# Patient Record
Sex: Female | Born: 1945 | Race: White | Hispanic: No | Smoking: Never smoker
Health system: Southern US, Community
[De-identification: ages and names within clinical notes are randomized; demographics above are authoritative.]

## PROBLEM LIST (undated history)

## (undated) DIAGNOSIS — I10 Essential (primary) hypertension: Secondary | ICD-10-CM

## (undated) DIAGNOSIS — I4891 Unspecified atrial fibrillation: Secondary | ICD-10-CM

## (undated) HISTORY — PX: CHOLECYSTECTOMY: SHX55

## (undated) HISTORY — PX: CATARACT EXTRACTION: SUR2

## (undated) HISTORY — DX: Unspecified atrial fibrillation: I48.91

## (undated) HISTORY — PX: BACK SURGERY: SHX140

## (undated) HISTORY — PX: FOOT SURGERY: SHX648

---

## 2010-12-21 ENCOUNTER — Emergency Department (HOSPITAL_BASED_OUTPATIENT_CLINIC_OR_DEPARTMENT_OTHER)
Admission: EM | Admit: 2010-12-21 | Discharge: 2010-12-22 | Disposition: A | Discharge status: Home / Self Care | Payer: Medicare Other | Attending: Emergency Medicine | Admitting: Emergency Medicine

## 2010-12-21 DIAGNOSIS — N390 Urinary tract infection, site not specified: Secondary | ICD-10-CM | POA: Insufficient documentation

## 2010-12-21 DIAGNOSIS — R109 Unspecified abdominal pain: Secondary | ICD-10-CM | POA: Insufficient documentation

## 2010-12-21 DIAGNOSIS — I10 Essential (primary) hypertension: Secondary | ICD-10-CM | POA: Insufficient documentation

## 2010-12-21 LAB — URINALYSIS, ROUTINE W REFLEX MICROSCOPIC
Bilirubin Urine: NEGATIVE
Ketones, ur: NEGATIVE mg/dL
Nitrite: NEGATIVE
Protein, ur: NEGATIVE mg/dL
Urobilinogen, UA: 1 mg/dL (ref 0.0–1.0)

## 2014-01-18 ENCOUNTER — Emergency Department (HOSPITAL_BASED_OUTPATIENT_CLINIC_OR_DEPARTMENT_OTHER): Payer: Medicare Other

## 2014-01-18 ENCOUNTER — Encounter (HOSPITAL_BASED_OUTPATIENT_CLINIC_OR_DEPARTMENT_OTHER): Payer: Self-pay | Admitting: Emergency Medicine

## 2014-01-18 ENCOUNTER — Emergency Department (HOSPITAL_BASED_OUTPATIENT_CLINIC_OR_DEPARTMENT_OTHER)
Admission: EM | Admit: 2014-01-18 | Discharge: 2014-01-18 | Disposition: A | Discharge status: Home / Self Care | Payer: Medicare Other | Attending: Emergency Medicine | Admitting: Emergency Medicine

## 2014-01-18 DIAGNOSIS — Y9289 Other specified places as the place of occurrence of the external cause: Secondary | ICD-10-CM | POA: Insufficient documentation

## 2014-01-18 DIAGNOSIS — S20219A Contusion of unspecified front wall of thorax, initial encounter: Secondary | ICD-10-CM | POA: Insufficient documentation

## 2014-01-18 DIAGNOSIS — Z880 Allergy status to penicillin: Secondary | ICD-10-CM | POA: Insufficient documentation

## 2014-01-18 DIAGNOSIS — Y939 Activity, unspecified: Secondary | ICD-10-CM | POA: Insufficient documentation

## 2014-01-18 DIAGNOSIS — W1809XA Striking against other object with subsequent fall, initial encounter: Secondary | ICD-10-CM | POA: Insufficient documentation

## 2014-01-18 DIAGNOSIS — S20211A Contusion of right front wall of thorax, initial encounter: Secondary | ICD-10-CM

## 2014-01-18 MED ORDER — OXYCODONE-ACETAMINOPHEN 5-325 MG PO TABS: 1.0000 | ORAL_TABLET | Freq: Four times a day (QID) | ORAL | Status: DC | PRN

## 2014-01-18 MED ORDER — OXYCODONE-ACETAMINOPHEN 5-325 MG PO TABS
2.0000 | ORAL_TABLET | Freq: Once | ORAL | Status: AC
  Administered 2014-01-18: 2 via ORAL
  Filled 2014-01-18: qty 2

## 2014-01-18 NOTE — ED Notes (Signed)
Tripped/fell aprox 1 hour PTA-pain to right rib from hitting on foot board knob

## 2014-01-18 NOTE — ED Provider Notes (Signed)
CSN: [id number]     Arrival date & time 01/18/14  1512 History   First MD Initiated Contact with Patient 01/18/14 1521     Chief Complaint  Patient presents with  . Fall     (Consider location/radiation/quality/duration/timing/severity/associated sxs/prior Treatment) The history is provided by the patient.  Melissa Bowen is a 68 y.o. female hx of previous back surgery here with fall. 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 The foot got caught and she tripped and fell and hit the right side of her ribs on a knob on the foot board. She had right-sided rib pain afterwards. Denies loss of consciousness or head injury. Denies abdominal pain.    History reviewed. No pertinent past medical history. Past Surgical History  Procedure Laterality Date  . Back surgery    . Cataract extraction    . Cholecystectomy    . Foot surgery     No family history on file. History  Substance Use Topics  . Smoking status: Never Smoker   . Smokeless tobacco: Not on file  . Alcohol Use: No   OB History   Grav Para Term Preterm Abortions TAB SAB Ect Mult Living                 Review of Systems  Musculoskeletal:       R sided rib pain   All other systems reviewed and are negative.     Allergies  Doxycycline and Penicillins  Home Medications   Prior to Admission medications   Medication Sig Start Date End Date Taking? Authorizing Provider  Fish Oil OIL by Does not apply route.   Yes Historical Provider, MD  HYDRALAZINE-HCTZ PO Take by mouth.   Yes Historical Provider, MD  Loratadine (CLARITIN PO) Take by mouth.   Yes Historical Provider, MD  Red Yeast Rice Extract (RED YEAST RICE PO) Take by mouth.   Yes Historical Provider, MD   BP 128/82  Pulse 92  Temp(Src) 98.3 F (36.8 C) (Oral)  Resp 16  Ht 5\' 8"  (1.727 m)  Wt 210 lb (95.255 kg)  BMI 31.94 kg/m2  SpO2 97% Physical Exam  Nursing note and vitals reviewed. Constitutional: She is oriented to person, place, and time. She  appears well-nourished.  Uncomfortable   HENT:  Head: Normocephalic and atraumatic.  Mouth/Throat: Oropharynx is clear and moist.  Eyes: Conjunctivae and EOM are normal. Pupils are equal, round, and reactive to light.  Neck: Normal range of motion. Neck supple.  Cardiovascular: Normal rate, regular rhythm and normal heart sounds.   Pulmonary/Chest: Effort normal and breath sounds normal. No respiratory distress. She has no wheezes. She has no rales.  + tenderness R anterior 10th and 11th ribs   Abdominal: Soft. Bowel sounds are normal. She exhibits no distension. There is no tenderness. There is no rebound and no guarding.  No RUQ tenderness no CVAT   Musculoskeletal: Normal range of motion. She exhibits no edema and no tenderness.  Neurological: She is alert and oriented to person, place, and time. No cranial nerve deficit. Coordination normal.  Skin: Skin is warm and dry.  Psychiatric: She has a normal mood and affect. Her behavior is normal. Judgment and thought content normal.    ED Course  Procedures (including critical care time) Labs Review Labs Reviewed - No data to display  Imaging Review Dg Ribs Unilateral W/chest Right  01/18/2014   CLINICAL DATA:  Fall, injury, RIGHT anterior rib pain  EXAM: RIGHT RIBS AND  CHEST - 3+ VIEW  COMPARISON:  None  FINDINGS: BB placed at site of symptoms lower RIGHT chest.  Normal heart size, mediastinal contours, and pulmonary vascularity.  Lungs clear.  No pleural effusion or pneumothorax.  No acute osseous findings.  Bones slightly demineralized.  No rib fracture or bone destruction.  IMPRESSION: No acute abnormalities.   Electronically Signed   By: Ulyses SouthwardMark  Boles M.D.   On: 01/18/2014 15:42     EKG Interpretation None      MDM   Final diagnoses:  None    Melissa Bowen is a 68 y.o. female here with R rib pain s/p injury. Will get xray to r/o rib fracture. Will give pain meds.   4:28 PM Xray showed no fracture. Felt better with percocet.  Will d/c home with same.    Richardean Canalavid H Yao, MD 01/18/14 [phone]

## 2014-01-18 NOTE — Discharge Instructions (Signed)
Take tylenol or motrin for pain.   For severe pain, take percocet as prescribed. Do NOT drive with it.   Follow up with your doctor,   Return to ER if you have trouble breathing, severe pain, fever.

## 2014-10-15 ENCOUNTER — Encounter (HOSPITAL_BASED_OUTPATIENT_CLINIC_OR_DEPARTMENT_OTHER): Payer: Self-pay | Admitting: *Deleted

## 2014-10-15 ENCOUNTER — Emergency Department (HOSPITAL_BASED_OUTPATIENT_CLINIC_OR_DEPARTMENT_OTHER): Payer: Medicare Other

## 2014-10-15 ENCOUNTER — Emergency Department (HOSPITAL_BASED_OUTPATIENT_CLINIC_OR_DEPARTMENT_OTHER)
Admission: EM | Admit: 2014-10-15 | Discharge: 2014-10-15 | Disposition: A | Discharge status: Home / Self Care | Payer: Medicare Other | Attending: Emergency Medicine | Admitting: Emergency Medicine

## 2014-10-15 DIAGNOSIS — Z79899 Other long term (current) drug therapy: Secondary | ICD-10-CM | POA: Insufficient documentation

## 2014-10-15 DIAGNOSIS — R42 Dizziness and giddiness: Secondary | ICD-10-CM | POA: Diagnosis not present

## 2014-10-15 DIAGNOSIS — Z880 Allergy status to penicillin: Secondary | ICD-10-CM | POA: Diagnosis not present

## 2014-10-15 LAB — COMPREHENSIVE METABOLIC PANEL
ALBUMIN: 4.1 g/dL (ref 3.5–5.2)
ALK PHOS: 97 U/L (ref 39–117)
ALT: 22 U/L (ref 0–35)
AST: 23 U/L (ref 0–37)
Anion gap: 6 (ref 5–15)
BILIRUBIN TOTAL: 1.4 mg/dL — AB (ref 0.3–1.2)
BUN: 19 mg/dL (ref 6–23)
CHLORIDE: 105 mmol/L (ref 96–112)
CO2: 24 mmol/L (ref 19–32)
CREATININE: 0.65 mg/dL (ref 0.50–1.10)
Calcium: 9 mg/dL (ref 8.4–10.5)
GFR calc Af Amer: >90 mL/min (ref >90)
GFR, EST NON AFRICAN AMERICAN: 89 mL/min — AB (ref >90)
Glucose, Bld: 125 mg/dL — ABNORMAL HIGH (ref 70–99)
Potassium: 3.3 mmol/L — ABNORMAL LOW (ref 3.5–5.1)
SODIUM: 135 mmol/L (ref 135–145)
TOTAL PROTEIN: 7.2 g/dL (ref 6.0–8.3)

## 2014-10-15 LAB — CBC WITH DIFFERENTIAL/PLATELET
BASOS PCT: 0 % (ref 0–1)
Basophils Absolute: 0 10*3/uL (ref 0.0–0.1)
EOS PCT: 0 % (ref 0–5)
Eosinophils Absolute: 0 10*3/uL (ref 0.0–0.7)
HEMATOCRIT: 40.5 % (ref 36.0–46.0)
Hemoglobin: 13.9 g/dL (ref 12.0–15.0)
LYMPHS ABS: 0.8 10*3/uL (ref 0.7–4.0)
Lymphocytes Relative: 7 % — ABNORMAL LOW (ref 12–46)
MCH: 29.3 pg (ref 26.0–34.0)
MCHC: 34.3 g/dL (ref 30.0–36.0)
MCV: 85.3 fL (ref 78.0–100.0)
MONO ABS: 0.5 10*3/uL (ref 0.1–1.0)
Monocytes Relative: 5 % (ref 3–12)
NEUTROS ABS: 10 10*3/uL — AB (ref 1.7–7.7)
Neutrophils Relative %: 88 % — ABNORMAL HIGH (ref 43–77)
Platelets: 229 10*3/uL (ref 150–400)
RBC: 4.75 MIL/uL (ref 3.87–5.11)
RDW: 13 % (ref 11.5–15.5)
WBC: 11.4 10*3/uL — AB (ref 4.0–10.5)

## 2014-10-15 LAB — CBG MONITORING, ED: Glucose-Capillary: 124 mg/dL — ABNORMAL HIGH (ref 70–99)

## 2014-10-15 MED ORDER — ONDANSETRON 4 MG PO TBDP: 4.0000 mg | ORAL_TABLET | Freq: Three times a day (TID) | ORAL | Status: DC | PRN

## 2014-10-15 MED ORDER — ONDANSETRON HCL 4 MG/2ML IJ SOLN
INTRAMUSCULAR | Status: AC
  Administered 2014-10-15: 4 mg
  Filled 2014-10-15: qty 2

## 2014-10-15 MED ORDER — ONDANSETRON 4 MG PO TBDP
4.0000 mg | ORAL_TABLET | Freq: Once | ORAL | Status: AC
  Administered 2014-10-15: 4 mg via ORAL

## 2014-10-15 MED ORDER — LORAZEPAM 2 MG/ML IJ SOLN
1.0000 mg | Freq: Once | INTRAMUSCULAR | Status: AC
  Administered 2014-10-15: 1 mg via INTRAVENOUS
  Filled 2014-10-15: qty 1

## 2014-10-15 MED ORDER — MECLIZINE HCL 25 MG PO TABS
25.0000 mg | ORAL_TABLET | Freq: Once | ORAL | Status: AC
  Administered 2014-10-15: 25 mg via ORAL
  Filled 2014-10-15: qty 1

## 2014-10-15 MED ORDER — ONDANSETRON HCL 4 MG/2ML IJ SOLN: 4.0000 mg | Freq: Once | INTRAMUSCULAR | Status: DC

## 2014-10-15 MED ORDER — MECLIZINE HCL 25 MG PO TABS: 25.0000 mg | ORAL_TABLET | Freq: Three times a day (TID) | ORAL | Status: DC | PRN

## 2014-10-15 MED ORDER — ONDANSETRON 4 MG PO TBDP
ORAL_TABLET | ORAL | Status: AC
  Administered 2014-10-15: 4 mg via ORAL
  Filled 2014-10-15: qty 1

## 2014-10-15 NOTE — ED Provider Notes (Signed)
CSN: [id number]     Arrival date & time 10/15/14  1850 History   First MD Initiated Contact with Patient 10/15/14 1904     Chief Complaint  Patient presents with  . Dizziness     (Consider location/radiation/quality/duration/timing/severity/associated sxs/prior Treatment) HPI Comments: Denies any previous history. Acute onset in nature. Worse with change of position. Has had vomiting  Patient is a 69 y.o. female presenting with dizziness. The history is provided by the patient. No language interpreter was used.  Dizziness Quality:  Room spinning Severity:  Severe Onset quality:  Sudden Timing:  Intermittent Progression:  Waxing and waning Chronicity:  New Context: bending over, head movement and standing up   Relieved by:  Nothing Worsened by:  Being still Ineffective treatments:  Being still and closing eyes   History reviewed. No pertinent past medical history. Past Surgical History  Procedure Laterality Date  . Back surgery    . Cataract extraction    . Cholecystectomy    . Foot surgery     History reviewed. No pertinent family history. History  Substance Use Topics  . Smoking status: Never Smoker   . Smokeless tobacco: Not on file  . Alcohol Use: No   OB History    No data available     Review of Systems  Neurological: Positive for dizziness.  All other systems reviewed and are negative.     Allergies  Doxycycline and Penicillins  Home Medications   Prior to Admission medications   Medication Sig Start Date End Date Taking? Authorizing Provider  Fish Oil OIL by Does not apply route.    Historical Provider, MD  HYDRALAZINE-HCTZ PO Take by mouth.    Historical Provider, MD  Loratadine (CLARITIN PO) Take by mouth.    Historical Provider, MD  oxyCODONE-acetaminophen (PERCOCET) 5-325 MG per tablet Take 1-2 tablets by mouth every 6 (six) hours as needed. 01/18/14   Richardean Canalavid H Yao, MD  Red Yeast Rice Extract (RED YEAST RICE PO) Take by mouth.    Historical  Provider, MD   BP 131/73 mmHg  Pulse 80  Temp(Src) 97.7 F (36.5 C) (Oral)  Resp 18  Ht 5\' 8"  (1.727 m)  Wt 215 lb (97.523 kg)  BMI 32.70 kg/m2  SpO2 98% Physical Exam  Constitutional: She is oriented to person, place, and time. She appears well-developed and well-nourished.  HENT:  Right Ear: External ear normal.  Left Ear: External ear normal.  Eyes: Conjunctivae are normal. Pupils are equal, round, and reactive to light.  Neck: Normal range of motion. Neck supple.  Cardiovascular: Normal rate and regular rhythm.   Pulmonary/Chest: Effort normal and breath sounds normal.  Abdominal: Soft. Bowel sounds are normal.  Musculoskeletal: Normal range of motion.  Neurological: She is alert and oriented to person, place, and time. She exhibits normal muscle tone. Coordination normal.  Negative finger to nose or romberg  Skin: Skin is warm and dry.  Nursing note and vitals reviewed.   ED Course  Procedures (including critical care time) Labs Review Labs Reviewed  COMPREHENSIVE METABOLIC PANEL - Abnormal; Notable for the following:    Potassium 3.3 (*)    Glucose, Bld 125 (*)    Total Bilirubin 1.4 (*)    GFR calc non Af Amer 89 (*)    All other components within normal limits  CBC WITH DIFFERENTIAL/PLATELET - Abnormal; Notable for the following:    WBC 11.4 (*)    Neutrophils Relative % 88 (*)    Neutro Abs 10.0 (*)  Lymphocytes Relative 7 (*)    All other components within normal limits  CBG MONITORING, ED - Abnormal; Notable for the following:    Glucose-Capillary 124 (*)    All other components within normal limits  URINALYSIS, ROUTINE W REFLEX MICROSCOPIC    Imaging Review Dg Chest 2 View  10/15/2014   CLINICAL DATA:  Sudden onset dizziness and vomiting this afternoon  EXAM: CHEST  2 VIEW  COMPARISON:  01/18/2014  FINDINGS: Normal heart size and pulmonary vascularity. No focal airspace disease or consolidation in the lungs. No blunting of costophrenic angles. No  pneumothorax. Mediastinal contours appear intact. Degenerative changes in the spine and shoulders.  IMPRESSION: No active cardiopulmonary disease.   Electronically Signed   By: Burman Nieves M.D.   On: 10/15/2014 21:07   Ct Head Wo Contrast  10/15/2014   CLINICAL DATA:  Dizziness with nausea and vomiting for 1 day  EXAM: CT HEAD WITHOUT CONTRAST  TECHNIQUE: Contiguous axial images were obtained from the base of the skull through the vertex without intravenous contrast.  COMPARISON:  None.  FINDINGS: The bony calvarium is intact. The ventricles are of normal size and configuration. No findings to suggest acute hemorrhage, acute infarction or space-occupying mass lesion are noted.  IMPRESSION: No acute intracranial abnormality noted.   Electronically Signed   By: Alcide Clever M.D.   On: 10/15/2014 21:20     EKG Interpretation   Date/Time:  Tuesday October 15 2014 19:25:05 EST Ventricular Rate:  73 PR Interval:  236 QRS Duration: 104 QT Interval:  394 QTC Calculation: 434 R Axis:   5 Text Interpretation:  Sinus rhythm with sinus arrhythmia with 1st degree  A-V block Nonspecific T wave abnormality Abnormal ECG Artifact No previous  ECGs available Confirmed by ZACKOWSKI  MD, SCOTT [phone]) on 10/15/2014  8:08:14 PM      MDM   Final diagnoses:  Dizziness  Vertigo    Pt doing a lot better after the ativan which was given because unsure if antivert was vomited up. Will leave with Dr. Herma Carson as pt is waiting for family to come    Teressa Lower, NP 10/15/14 2207

## 2014-10-15 NOTE — ED Notes (Signed)
Pt c/o sudden onset of dizziness with vomiting x 7 hrs

## 2014-10-15 NOTE — ED Provider Notes (Signed)
Medical screening examination/treatment/procedure(s) were conducted as a shared visit with non-physician practitioner(s) and myself.  I personally evaluated the patient during the encounter.   EKG Interpretation   Date/Time:  Tuesday October 15 2014 19:25:05 EST Ventricular Rate:  73 PR Interval:  236 QRS Duration: 104 QT Interval:  394 QTC Calculation: 434 R Axis:   5 Text Interpretation:  Sinus rhythm with sinus arrhythmia with 1st degree  A-V block Nonspecific T wave abnormality Abnormal ECG Artifact No previous  ECGs available Confirmed by Jakaria Lavergne  MD, Inigo Lantigua [phone]([id number]) on 10/15/2014  8:08:14 PM     Results for orders placed or performed during the hospital encounter of 10/15/14  Comprehensive metabolic panel  Result Value Ref Range   Sodium 135 135 - 145 mmol/L   Potassium 3.3 (L) 3.5 - 5.1 mmol/L   Chloride 105 96 - 112 mmol/L   CO2 24 19 - 32 mmol/L   Glucose, Bld 125 (H) 70 - 99 mg/dL   BUN 19 6 - 23 mg/dL   Creatinine, Ser 2.950.65 0.50 - 1.10 mg/dL   Calcium 9.0 8.4 - 18.810.5 mg/dL   Total Protein 7.2 6.0 - 8.3 g/dL   Albumin 4.1 3.5 - 5.2 g/dL   AST 23 0 - 37 U/L   ALT 22 0 - 35 U/L   Alkaline Phosphatase 97 39 - 117 U/L   Total Bilirubin 1.4 (H) 0.3 - 1.2 mg/dL   GFR calc non Af Amer 89 (L) >90 mL/min   GFR calc Af Amer >90 >90 mL/min   Anion gap 6 5 - 15  CBC with Differential  Result Value Ref Range   WBC 11.4 (H) 4.0 - 10.5 K/uL   RBC 4.75 3.87 - 5.11 MIL/uL   Hemoglobin 13.9 12.0 - 15.0 g/dL   HCT 41.640.5 60.636.0 - 30.146.0 %   MCV 85.3 78.0 - 100.0 fL   MCH 29.3 26.0 - 34.0 pg   MCHC 34.3 30.0 - 36.0 g/dL   RDW 60.113.0 09.311.5 - 23.515.5 %   Platelets 229 150 - 400 K/uL   Neutrophils Relative % 88 (H) 43 - 77 %   Neutro Abs 10.0 (H) 1.7 - 7.7 K/uL   Lymphocytes Relative 7 (L) 12 - 46 %   Lymphs Abs 0.8 0.7 - 4.0 K/uL   Monocytes Relative 5 3 - 12 %   Monocytes Absolute 0.5 0.1 - 1.0 K/uL   Eosinophils Relative 0 0 - 5 %   Eosinophils Absolute 0.0 0.0 - 0.7 K/uL   Basophils  Relative 0 0 - 1 %   Basophils Absolute 0.0 0.0 - 0.1 K/uL  CBG monitoring, ED  Result Value Ref Range   Glucose-Capillary 124 (H) 70 - 99 mg/dL   Dg Chest 2 View  5/73/22021/26/2016   CLINICAL DATA:  Sudden onset dizziness and vomiting this afternoon  EXAM: CHEST  2 VIEW  COMPARISON:  01/18/2014  FINDINGS: Normal heart size and pulmonary vascularity. No focal airspace disease or consolidation in the lungs. No blunting of costophrenic angles. No pneumothorax. Mediastinal contours appear intact. Degenerative changes in the spine and shoulders.  IMPRESSION: No active cardiopulmonary disease.   Electronically Signed   By: Burman NievesWilliam  Stevens M.D.   On: 10/15/2014 21:07   Ct Head Wo Contrast  10/15/2014   CLINICAL DATA:  Dizziness with nausea and vomiting for 1 day  EXAM: CT HEAD WITHOUT CONTRAST  TECHNIQUE: Contiguous axial images were obtained from the base of the skull through the vertex without intravenous contrast.  COMPARISON:  None.  FINDINGS: The bony calvarium is intact. The ventricles are of normal size and configuration. No findings to suggest acute hemorrhage, acute infarction or space-occupying mass lesion are noted.  IMPRESSION: No acute intracranial abnormality noted.   Electronically Signed   By: Alcide Clever M.D.   On: 10/15/2014 21:20    Patient was sudden onset of dizziness room spinning nausea and vomiting around noon time today. Patient's symptoms consistent with vertigo could be viral base. Head CT negative chest x-ray negative labs without any significant abnormalities. Some mild hypokalemia. Patient improved with medicines here had anti-for Zofran and Ativan but not completely cured. Patient should be discharged home with anti-for antinausea medicine. Follow-up with her doctor planned for next week if symptoms persist additional workup would be required to include MRI of the brain. Patient will return if unable to keep fluids down in the next 24 hours. Patient's physical exam without any  significant neuro deficits. Symptoms are made worse by moving her head.  Vanetta Mulders, MD 10/15/14 2147

## 2014-10-15 NOTE — Discharge Instructions (Signed)
Benign Positional Vertigo Vertigo means you feel like you or your surroundings are moving when they are not. Benign positional vertigo is the most common form of vertigo. Benign means that the cause of your condition is not serious. Benign positional vertigo is more common in older adults. CAUSES  Benign positional vertigo is the result of an upset in the labyrinth system. This is an area in the middle ear that helps control your balance. This may be caused by a viral infection, head injury, or repetitive motion. However, often no specific cause is found. SYMPTOMS  Symptoms of benign positional vertigo occur when you move your head or eyes in different directions. Some of the symptoms may include:  Loss of balance and falls.  Vomiting.  Blurred vision.  Dizziness.  Nausea.  Involuntary eye movements (nystagmus). DIAGNOSIS  Benign positional vertigo is usually diagnosed by physical exam. If the specific cause of your benign positional vertigo is unknown, your caregiver may perform imaging tests, such as magnetic resonance imaging (MRI) or computed tomography (CT). TREATMENT  Your caregiver may recommend movements or procedures to correct the benign positional vertigo. Medicines such as meclizine, benzodiazepines, and medicines for nausea may be used to treat your symptoms. In rare cases, if your symptoms are caused by certain conditions that affect the inner ear, you may need surgery. HOME CARE INSTRUCTIONS   Follow your caregiver's instructions.  Move slowly. Do not make sudden body or head movements.  Avoid driving.  Avoid operating heavy machinery.  Avoid performing any tasks that would be dangerous to you or others during a vertigo episode.  Drink enough fluids to keep your urine clear or pale yellow. SEEK IMMEDIATE MEDICAL CARE IF:   You develop problems with walking, weakness, numbness, or using your arms, hands, or legs.  You have difficulty speaking.  You develop  severe headaches.  Your nausea or vomiting continues or gets worse.  You develop visual changes.  Your family or friends notice any behavioral changes.  Your condition gets worse.  You have a fever.  You develop a stiff neck or sensitivity to light. MAKE SURE YOU:   Understand these instructions.  Will watch your condition.  Will get help right away if you are not doing well or get worse. Document Released: 06/14/2006 Document Revised: 11/29/2011 Document Reviewed: 05/27/2011 [employer] Patient Information 2015 [employer]. This information is not intended to replace advice given to you by your health care provider. Make sure you discuss any questions you have with your health care provider.    

## 2017-03-13 ENCOUNTER — Encounter (HOSPITAL_BASED_OUTPATIENT_CLINIC_OR_DEPARTMENT_OTHER): Payer: Self-pay | Admitting: Emergency Medicine

## 2017-03-13 ENCOUNTER — Emergency Department (HOSPITAL_BASED_OUTPATIENT_CLINIC_OR_DEPARTMENT_OTHER)
Admission: EM | Admit: 2017-03-13 | Discharge: 2017-03-13 | Disposition: A | Discharge status: Home / Self Care | Payer: Medicare Other | Attending: Emergency Medicine | Admitting: Emergency Medicine

## 2017-03-13 ENCOUNTER — Emergency Department (HOSPITAL_BASED_OUTPATIENT_CLINIC_OR_DEPARTMENT_OTHER): Payer: Medicare Other

## 2017-03-13 DIAGNOSIS — R2243 Localized swelling, mass and lump, lower limb, bilateral: Secondary | ICD-10-CM | POA: Insufficient documentation

## 2017-03-13 DIAGNOSIS — M79605 Pain in left leg: Secondary | ICD-10-CM | POA: Diagnosis present

## 2017-03-13 DIAGNOSIS — M7989 Other specified soft tissue disorders: Secondary | ICD-10-CM

## 2017-03-13 DIAGNOSIS — Z79899 Other long term (current) drug therapy: Secondary | ICD-10-CM | POA: Insufficient documentation

## 2017-03-13 DIAGNOSIS — M79604 Pain in right leg: Secondary | ICD-10-CM | POA: Insufficient documentation

## 2017-03-13 LAB — CBC WITH DIFFERENTIAL/PLATELET
BASOS ABS: 0.1 10*3/uL (ref 0.0–0.1)
Basophils Relative: 1 %
EOS ABS: 0.2 10*3/uL (ref 0.0–0.7)
Eosinophils Relative: 2 %
HCT: 37.3 % (ref 36.0–46.0)
Hemoglobin: 12.9 g/dL (ref 12.0–15.0)
LYMPHS PCT: 29 %
Lymphs Abs: 1.8 10*3/uL (ref 0.7–4.0)
MCH: 30.9 pg (ref 26.0–34.0)
MCHC: 34.6 g/dL (ref 30.0–36.0)
MCV: 89.4 fL (ref 78.0–100.0)
MONO ABS: 0.7 10*3/uL (ref 0.1–1.0)
Monocytes Relative: 11 %
Neutro Abs: 3.6 10*3/uL (ref 1.7–7.7)
Neutrophils Relative %: 57 %
Platelets: 207 10*3/uL (ref 150–400)
RBC: 4.17 MIL/uL (ref 3.87–5.11)
RDW: 13.2 % (ref 11.5–15.5)
WBC: 6.3 10*3/uL (ref 4.0–10.5)

## 2017-03-13 LAB — BASIC METABOLIC PANEL
ANION GAP: 10 (ref 5–15)
BUN: 13 mg/dL (ref 6–20)
CALCIUM: 9.1 mg/dL (ref 8.9–10.3)
CO2: 26 mmol/L (ref 22–32)
Chloride: 102 mmol/L (ref 101–111)
Creatinine, Ser: 0.87 mg/dL (ref 0.44–1.00)
GFR calc Af Amer: >60 mL/min (ref >60)
GLUCOSE: 100 mg/dL — AB (ref 65–99)
POTASSIUM: 4 mmol/L (ref 3.5–5.1)
SODIUM: 138 mmol/L (ref 135–145)

## 2017-03-13 NOTE — ED Provider Notes (Signed)
[facility] Provider Note   CSN: [id number] Arrival date & time: 03/13/17  1111     History   Chief Complaint Chief Complaint  Patient presents with  . Leg Pain    bilateral    HPI Melissa Bowen is a 71 y.o. female.  Patient presents with complaint of bilateral leg swelling and rash. Patient was recently on a bus trip [place] [address]. She states that she was out of the bus every several hours to walk around. Swelling worsened during the day and was better in the morning after elevation. No chest pain or shortness of breath. She noticed a rash on her bilateral lower extremities in the past day. She was concerned of recurrent cellulitis. No fever, history of DVT or blood clots. She is not anticoagulated. She has not had any easy bruising, bleeding of her gums, blood in stool or urine. Aggravating factors: none. Alleviating factors: none.        History reviewed. No pertinent past medical history.  There are no active problems to display for this patient.   Past Surgical History:  Procedure Laterality Date  . BACK SURGERY    . CATARACT EXTRACTION    . CHOLECYSTECTOMY    . FOOT SURGERY      OB History    No data available       Home Medications    Prior to Admission medications   Medication Sig Start Date End Date Taking? Authorizing Provider  Fish Oil OIL by Does not apply route.    [provider]  HYDRALAZINE-HCTZ PO Take by mouth.    [provider]  Loratadine (CLARITIN PO) Take by mouth.    [provider]  meclizine (ANTIVERT) 25 MG tablet Take 1 tablet (25 mg total) by mouth 3 (three) times daily as needed for dizziness. 10/15/14   Teressa LowerPickering, Vrinda, NP  ondansetron (ZOFRAN ODT) 4 MG disintegrating tablet Take 1 tablet (4 mg total) by mouth every 8 (eight) hours as needed for nausea or vomiting. 10/15/14   Teressa LowerPickering, Vrinda, NP  oxyCODONE-acetaminophen (PERCOCET) 5-325 MG per tablet Take 1-2 tablets by mouth every 6 (six) hours  as needed. 01/18/14   Charlynne PanderYao, David Hsienta, MD  Red Yeast Rice Extract (RED YEAST RICE PO) Take by mouth.    [provider]    Family History History reviewed. No pertinent family history.  Social History Social History  Substance Use Topics  . Smoking status: Never Smoker  . Smokeless tobacco: Not on file  . Alcohol use No     Allergies   Doxycycline and Penicillins   Review of Systems Review of Systems  Constitutional: Negative for fever.  HENT: Negative for rhinorrhea and sore throat.   Eyes: Negative for redness.  Respiratory: Negative for cough.   Cardiovascular: Positive for leg swelling. Negative for chest pain.  Gastrointestinal: Negative for abdominal pain, diarrhea, nausea and vomiting.  Genitourinary: Negative for dysuria.  Musculoskeletal: Negative for myalgias.  Skin: Positive for rash.  Neurological: Negative for headaches.     Physical Exam Updated Vital Signs BP (!) 154/85 (BP Location: Left Arm)   Pulse 80   Temp 98.7 F (37.1 C) (Oral)   Resp 20   Ht 5\' 8"  (1.727 m)   Wt 100.4 kg (221 lb 5.5 oz)   SpO2 98%   BMI 33.65 kg/m   Physical Exam  Constitutional: She appears well-developed and well-nourished.  HENT:  Head: Normocephalic and atraumatic.  Mouth/Throat: Oropharynx is clear and moist.  No intraoral lesions.  Eyes: Conjunctivae are normal. Right eye exhibits no discharge. Left eye exhibits no discharge.  Neck: Normal range of motion. Neck supple.  Cardiovascular: Normal rate, regular rhythm and normal heart sounds.   Pulmonary/Chest: Effort normal and breath sounds normal. No respiratory distress.  Abdominal: Soft. There is no tenderness. There is no rebound and no guarding.  Musculoskeletal: She exhibits edema.  Trace bilateral, nonpitting lower extremities edema. Patient has a very light petechial rash noted to her bilateral calves.  Neurological: She is alert.  Skin: Skin is warm and dry.  Psychiatric: She has a normal mood  and affect.  Nursing note and vitals reviewed.    ED Treatments / Results  Labs (all labs ordered are listed, but only abnormal results are displayed) Labs Reviewed  BASIC METABOLIC PANEL - Abnormal; Notable for the following:       Result Value   Glucose, Bld 100 (*)    All other components within normal limits  CBC WITH DIFFERENTIAL/PLATELET    EKG  EKG Interpretation None       Radiology US Venous Img Lower Bilateral  Result Date: 03/13/2017 CLINICAL DATA:  Bilateral lower extremity burning sensation, pain and edema EXAM: BILATERAL LOWER EXTREMITY VENOUS DOPPLER ULTRASOUND TECHNIQUE: Gray-scale sonography with graded compression, as well as color Doppler and duplex ultrasound were performed to evaluate the lower extremity deep venous systems from the level of the common femoral vein and including the common femoral, femoral, profunda femoral, popliteal and calf veins including the posterior tibial, peroneal and gastrocnemius veins when visible. The superficial great saphenous vein was also interrogated. Spectral Doppler was utilized to evaluate flow at rest and with distal augmentation maneuvers in the common femoral, femoral and popliteal veins. COMPARISON:  None. FINDINGS: RIGHT LOWER EXTREMITY Common Femoral Vein: No evidence of thrombus. Normal compressibility, respiratory phasicity and response to augmentation. Saphenofemoral Junction: No evidence of thrombus. Normal compressibility and flow on color Doppler imaging. Profunda Femoral Vein: No evidence of thrombus. Normal compressibility and flow on color Doppler imaging. Femoral Vein: No evidence of thrombus. Normal compressibility, respiratory phasicity and response to augmentation. Popliteal Vein: No evidence of thrombus. Normal compressibility, respiratory phasicity and response to augmentation. Calf Veins: No evidence of thrombus. Normal compressibility and flow on color Doppler imaging. Superficial Great Saphenous Vein: No  evidence of thrombus. Normal compressibility and flow on color Doppler imaging. Venous Reflux:  None. Other Findings:  None. LEFT LOWER EXTREMITY Common Femoral Vein: No evidence of thrombus. Normal compressibility, respiratory phasicity and response to augmentation. Saphenofemoral Junction: No evidence of thrombus. Normal compressibility and flow on color Doppler imaging. Profunda Femoral Vein: No evidence of thrombus. Normal compressibility and flow on color Doppler imaging. Femoral Vein: No evidence of thrombus. Normal compressibility, respiratory phasicity and response to augmentation. Popliteal Vein: No evidence of thrombus. Normal compressibility, respiratory phasicity and response to augmentation. Calf Veins: No evidence of thrombus. Normal compressibility and flow on color Doppler imaging. Superficial Great Saphenous Vein: No evidence of thrombus. Normal compressibility and flow on color Doppler imaging. Venous Reflux:  None. Other Findings:  None. IMPRESSION: No evidence of DVT within either lower extremity. Electronically Signed   By: Judie Petit.  Shick M.D.   On: 03/13/2017 13:18    Procedures Procedures (including critical care time)  Medications Ordered in ED Medications - No data to display   Initial Impression / Assessment and Plan / ED Course  I have reviewed the triage vital signs and the nursing notes.  Pertinent labs &  imaging results that were available during my care of the patient were reviewed by me and considered in my medical decision making (see chart for details).     Patient seen and examined. Work-up initiated. Given history will rule out DVT. Will check platelets. No signs of cellulitis at time of exam.  Vital signs reviewed and are as follows: BP (!) 154/85 (BP Location: Left Arm)   Pulse 80   Temp 98.7 F (37.1 C) (Oral)   Resp 20   Ht 5\' 8"  (1.727 m)   Wt 100.4 kg (221 lb 5.5 oz)   SpO2 98%   BMI 33.65 kg/m   1:42 PM ultrasound negative, platelets are normal.  Patient discussed with and seen by Dr. Clayborne Dana.  Patient will increase HCTZ for several days, perform measures to help leg swelling. Encouraged PCP follow-up if not improved. Return with worsening chest pain or shortness of breath, redness or pain in her legs.  Final Clinical Impressions(s) / ED Diagnoses   Final diagnoses:  Leg swelling   Patient with bilateral lower extremity edema, likely related to venous insufficiency from recent travel. No signs of DVT today on ultrasound. Patient has a light petechial rash to her ankles. Suspect benign etiology. Normal platelet count. No other bleeding symptoms. This may be due to increased pressure in her legs due to edema/venous insufficiency. No signs of cellulitis today.  New Prescriptions New Prescriptions   No medications on file     Renne Crigler, Cordelia Poche 03/13/17 1344    Mesner, Barbara Cower, MD 03/13/17 [phone]

## 2017-03-13 NOTE — ED Triage Notes (Signed)
Pt reports bilateral leg "burnuing feeling". Recurrent issue, was seen and rule out DVT 1 year ago. sts Hx cellulitis  Right leg,

## 2017-03-13 NOTE — Discharge Instructions (Signed)
Please read and follow all provided instructions.  Your diagnoses today include:  1. Leg swelling     Tests performed today include:  Ultrasound - does not show any blood clot  Blood counts  Vital signs. See below for your results today.   Medications prescribed:   None  Take any prescribed medications only as directed.  Home care instructions:  Follow any educational materials contained in this packet.  You may double your HCTZ for the next 3 days to help with swelling. Avoid excessive salt intake. Keep legs elevated and wear compression stockings when able.  Follow-up instructions: Please follow-up with your primary care provider in the next 3 days for further evaluation of your symptoms if not improved.   Return instructions:   Please return to the Emergency Department if you experience worsening symptoms.   Return with any chest pain or shortness of breath. Return with redness of the legs or pain.  Please return if you have any other emergent concerns.  Additional Information:  Your vital signs today were: BP (!) 154/85 (BP Location: Left Arm)    Pulse 80    Temp 98.7 F (37.1 C) (Oral)    Resp 20    Ht 5\' 8"  (1.727 m)    Wt 100.4 kg (221 lb 5.5 oz)    SpO2 98%    BMI 33.65 kg/m  If your blood pressure (BP) was elevated above 135/85 this visit, please have this repeated by your doctor within one month. --------------

## 2019-10-28 ENCOUNTER — Ambulatory Visit: Payer: Medicare PPO | Attending: Internal Medicine

## 2019-10-28 DIAGNOSIS — Z23 Encounter for immunization: Secondary | ICD-10-CM | POA: Insufficient documentation

## 2019-10-28 NOTE — Progress Notes (Signed)
   Covid-19 Vaccination Clinic  Name:  Melissa Bowen    MRN: [id number] DOB: November 05, 1945  10/28/2019  [name] was observed post Covid-19 immunization for 15 minutes without incidence. She was provided with Vaccine Information Sheet and instruction to access the V-Safe system.   [name] was instructed to call 911 with any severe reactions post vaccine: Marland Kitchen Difficulty breathing  . Swelling of your face and throat  . A fast heartbeat  . A bad rash all over your body  . Dizziness and weakness    Immunizations Administered    Name Date Dose VIS Date Route   Pfizer COVID-19 Vaccine 10/28/2019  4:07 PM 0.3 mL 08/31/2019 Intramuscular   Manufacturer: [employer], [employer]   Lot: [id number]   NDC: [id number]

## 2019-11-14 ENCOUNTER — Ambulatory Visit: Payer: Medicare Other

## 2019-11-21 ENCOUNTER — Ambulatory Visit: Payer: Medicare PPO

## 2019-11-21 ENCOUNTER — Ambulatory Visit: Payer: Medicare PPO | Attending: Internal Medicine

## 2019-11-21 DIAGNOSIS — Z23 Encounter for immunization: Secondary | ICD-10-CM | POA: Insufficient documentation

## 2019-11-21 NOTE — Progress Notes (Signed)
   Covid-19 Vaccination Clinic  Name:  Melissa Bowen    MRN: [id number] DOB: [birth year]  11/21/2019  Melissa Bowen was observed post Covid-19 immunization for 15 minutes without incident. She was provided with Vaccine Information Sheet and instruction to access the V-Safe system.   Melissa Bowen was instructed to call 911 with any severe reactions post vaccine: Marland Kitchen Difficulty breathing  . Swelling of face and throat  . A fast heartbeat  . A bad rash all over body  . Dizziness and weakness   Immunizations Administered    Name Date Dose VIS Date Route   Pfizer COVID-19 Vaccine 11/21/2019  9:11 AM 0.3 mL 08/31/2019 Intramuscular   Manufacturer: [employer], [employer]   Lot: [id number]   NDC: [address]-2

## 2022-02-13 ENCOUNTER — Other Ambulatory Visit: Payer: Self-pay

## 2022-02-13 ENCOUNTER — Encounter (HOSPITAL_BASED_OUTPATIENT_CLINIC_OR_DEPARTMENT_OTHER): Payer: Self-pay | Admitting: Emergency Medicine

## 2022-02-13 ENCOUNTER — Emergency Department (HOSPITAL_BASED_OUTPATIENT_CLINIC_OR_DEPARTMENT_OTHER)
Admission: EM | Admit: 2022-02-13 | Discharge: 2022-02-13 | Disposition: A | Discharge status: Home / Self Care | Payer: Medicare PPO | Attending: Emergency Medicine | Admitting: Emergency Medicine

## 2022-02-13 DIAGNOSIS — Z7901 Long term (current) use of anticoagulants: Secondary | ICD-10-CM | POA: Insufficient documentation

## 2022-02-13 DIAGNOSIS — J069 Acute upper respiratory infection, unspecified: Secondary | ICD-10-CM | POA: Insufficient documentation

## 2022-02-13 DIAGNOSIS — Z79899 Other long term (current) drug therapy: Secondary | ICD-10-CM | POA: Diagnosis not present

## 2022-02-13 DIAGNOSIS — R059 Cough, unspecified: Secondary | ICD-10-CM | POA: Diagnosis present

## 2022-02-13 HISTORY — DX: Essential (primary) hypertension: I10

## 2022-02-13 MED ORDER — BENZONATATE 100 MG PO CAPS: 100.0000 mg | ORAL_CAPSULE | Freq: Three times a day (TID) | ORAL | 0 refills | Status: AC

## 2022-02-13 MED ORDER — DEXAMETHASONE 4 MG PO TABS
10.0000 mg | ORAL_TABLET | Freq: Once | ORAL | Status: AC
Start: 2022-02-13 — End: 2022-02-13
  Administered 2022-02-13: 10 mg via ORAL
  Filled 2022-02-13: qty 3

## 2022-02-13 NOTE — ED Notes (Signed)
Triage delayed, pt to restroom 

## 2022-02-13 NOTE — ED Provider Notes (Signed)
[facility] EMERGENCY DEPARTMENT Provider Note   CSN: [id number] Arrival date & time: 02/13/22  6967     History  Chief Complaint  Patient presents with   Cough    Melissa Bowen is a 76 y.o. female.  76 yo F with a chief complaints of cough.  This been going on since yesterday and got worse this morning.  She tells me she gets bronchitis about once a year and the only thing that tends to make it better or steroids.  She felt like she was barking like a seal this morning.  No fevers no difficulty breathing no nausea vomiting or diarrhea.  Eating and drinking normally.   Cough     Home Medications Prior to Admission medications   Medication Sig Start Date End Date Taking? Authorizing Provider  amiodarone (PACERONE) 200 MG tablet Take 1 tablet daily until directed to take extra. 08/28/21  Yes [provider]  benzonatate (TESSALON) 100 MG capsule Take 1 capsule (100 mg total) by mouth every 8 (eight) hours. 02/13/22  Yes Melene Plan, DO  diltiazem (CARDIZEM SR) 90 MG 12 hr capsule Take by mouth. 11/25/21  Yes [provider]  furosemide (LASIX) 40 MG tablet Take by mouth. 06/09/21  Yes [provider]  lisinopril (ZESTRIL) 5 MG tablet Take by mouth. 01/29/22 04/29/22 Yes [provider]  ELIQUIS 2.5 MG TABS tablet Take 2.5 mg by mouth 2 (two) times daily. 02/08/22   [provider]  Fish Oil OIL by Does not apply route.    [provider]  HYDRALAZINE-HCTZ PO Take by mouth.    [provider]  Loratadine (CLARITIN PO) Take by mouth.    [provider]  meclizine (ANTIVERT) 25 MG tablet Take 1 tablet (25 mg total) by mouth 3 (three) times daily as needed for dizziness. 10/15/14   Teressa Lower, NP  ondansetron (ZOFRAN ODT) 4 MG disintegrating tablet Take 1 tablet (4 mg total) by mouth every 8 (eight) hours as needed for nausea or vomiting. 10/15/14   Teressa Lower, NP  oxyCODONE-acetaminophen (PERCOCET)  5-325 MG per tablet Take 1-2 tablets by mouth every 6 (six) hours as needed. 01/18/14   Charlynne Pander, MD  Red Yeast Rice Extract (RED YEAST RICE PO) Take by mouth.    [provider]      Allergies    Doxycycline and Penicillins    Review of Systems   Review of Systems  Respiratory:  Positive for cough.    Physical Exam Updated Vital Signs BP 133/65   Pulse 69   Temp 98.8 F (37.1 C) (Oral)   Resp 20   SpO2 97%  Physical Exam Vitals and nursing note reviewed.  Constitutional:      General: She is not in acute distress.    Appearance: She is well-developed. She is not diaphoretic.  HENT:     Head: Normocephalic and atraumatic.  Eyes:     Pupils: Pupils are equal, round, and reactive to light.  Cardiovascular:     Rate and Rhythm: Normal rate and regular rhythm.     Heart sounds: No murmur heard.   No friction rub. No gallop.  Pulmonary:     Effort: Pulmonary effort is normal.     Breath sounds: No wheezing or rales.  Abdominal:     General: There is no distension.     Palpations: Abdomen is soft.     Tenderness: There is no abdominal tenderness.  Musculoskeletal:  General: No tenderness.     Cervical back: Normal range of motion and neck supple.  Skin:    General: Skin is warm and dry.  Neurological:     Mental Status: She is alert and oriented to person, place, and time.  Psychiatric:        Behavior: Behavior normal.    ED Results / Procedures / Treatments   Labs (all labs ordered are listed, but only abnormal results are displayed) Labs Reviewed - No data to display  EKG None  Radiology No results found.  Procedures Procedures    Medications Ordered in ED Medications  dexamethasone (DECADRON) tablet 10 mg (has no administration in time range)    ED Course/ Medical Decision Making/ A&P                           Medical Decision Making Risk Prescription drug management.   76 yo F with a chief complaint of a cough.  The  patient is well-appearing nontoxic is clear lung sounds on exam.  Most likely her symptoms are viral in etiology.  She tells me that this only improves with steroids and she came in early in the course so that she would get worse.  We will give a single dose of Decadron here.  We will have her call her doctor on Tuesday.  10:08 AM:  I have discussed the diagnosis/risks/treatment options with the patient.  Evaluation and diagnostic testing in the emergency department does not suggest an emergent condition requiring admission or immediate intervention beyond what has been performed at this time.  They will follow up with  PCP. We also discussed returning to the ED immediately if new or worsening sx occur. We discussed the sx which are most concerning (e.g., sudden worsening pain, fever, inability to tolerate by mouth, sob, confusion) that necessitate immediate return. Medications administered to the patient during their visit and any new prescriptions provided to the patient are listed below.  Medications given during this visit Medications  dexamethasone (DECADRON) tablet 10 mg (has no administration in time range)     The patient appears reasonably screen and/or stabilized for discharge and I doubt any other medical condition or other [facility] requiring further screening, evaluation, or treatment in the ED at this time prior to discharge.          Final Clinical Impression(s) / ED Diagnoses Final diagnoses:  Viral URI with cough    Rx / DC Orders ED Discharge Orders          Ordered    benzonatate (TESSALON) 100 MG capsule  Every 8 hours        02/13/22 0957              Melene Plan, DO 02/13/22 1008

## 2022-02-13 NOTE — Discharge Instructions (Signed)
Please return for difficulty breathing, fever, confusion.

## 2022-02-13 NOTE — ED Notes (Signed)
Pt relates she feels better and has been tolerating ice chips

## 2022-02-13 NOTE — ED Triage Notes (Signed)
Pt arrives to ED cane assisted gait, c/o cough starting yesterday. Denies shob or fever

## 2022-02-13 NOTE — ED Notes (Signed)
ED Provider at bedside. 

## 2022-07-30 ENCOUNTER — Encounter (HOSPITAL_BASED_OUTPATIENT_CLINIC_OR_DEPARTMENT_OTHER): Payer: Self-pay | Admitting: Emergency Medicine

## 2022-07-30 ENCOUNTER — Emergency Department (HOSPITAL_BASED_OUTPATIENT_CLINIC_OR_DEPARTMENT_OTHER)
Admission: EM | Admit: 2022-07-30 | Discharge: 2022-07-30 | Disposition: A | Discharge status: Home / Self Care | Payer: Medicare PPO | Attending: Emergency Medicine | Admitting: Emergency Medicine

## 2022-07-30 DIAGNOSIS — R059 Cough, unspecified: Secondary | ICD-10-CM | POA: Diagnosis not present

## 2022-07-30 DIAGNOSIS — H6992 Unspecified Eustachian tube disorder, left ear: Secondary | ICD-10-CM | POA: Insufficient documentation

## 2022-07-30 MED ORDER — METHYLPREDNISOLONE 4 MG PO TBPK: ORAL_TABLET | ORAL | 0 refills | Status: AC

## 2022-07-30 NOTE — Discharge Instructions (Addendum)
Contact a health care provider if: Your symptoms do not go away after treatment. Your symptoms come back after treatment. You are unable to pop your ears. You have: A fever. Pain in your ear. Pain in your head or neck. Fluid draining from your ear. Your hearing suddenly changes. You become very dizzy. You lose your balance. Get help right away if: You have a sudden, severe increase in any of your symptoms. 

## 2022-07-30 NOTE — ED Triage Notes (Signed)
Patient presents POV, reports she has been unable to hear out of her L ear X few days. States it feels like "water is dripping" inside of her ear.

## 2022-07-30 NOTE — ED Provider Notes (Signed)
[facility] EMERGENCY DEPARTMENT Provider Note   CSN: [id number] Arrival date & time: 07/30/22  1231     History  Chief Complaint  Patient presents with   Ear Problem    Melissa Bowen is a 76 y.o. female c/o L ear discomfort for 2 days. + decreased hearing and it "sounds like dripping water." Recent URI, cough , and congestion.  She has been having little bit of a productive cough but denies fevers, chills, vomiting, neck stiffness, headache or rash.  HPI     Home Medications Prior to Admission medications   Medication Sig Start Date End Date Taking? Authorizing Provider  methylPREDNISolone (MEDROL DOSEPAK) 4 MG TBPK tablet Use as directed 07/30/22  Yes Raizy Auzenne, PA-C  amiodarone (PACERONE) 200 MG tablet Take 1 tablet daily until directed to take extra. 08/28/21   [provider]  benzonatate (TESSALON) 100 MG capsule Take 1 capsule (100 mg total) by mouth every 8 (eight) hours. 02/13/22   Melene Plan, DO  diltiazem (CARDIZEM SR) 90 MG 12 hr capsule Take by mouth. 11/25/21   [provider]  ELIQUIS 2.5 MG TABS tablet Take 2.5 mg by mouth 2 (two) times daily. 02/08/22   [provider]  Fish Oil OIL by Does not apply route.    [provider]  furosemide (LASIX) 40 MG tablet Take by mouth. 06/09/21   [provider]  lisinopril (ZESTRIL) 5 MG tablet Take by mouth. 01/29/22 04/29/22  [provider]  Loratadine (CLARITIN PO) Take by mouth.    [provider]  Red Yeast Rice Extract (RED YEAST RICE PO) Take by mouth.    [provider]      Allergies    Doxycycline, Penicillins, and Statins    Review of Systems   Review of Systems  Physical Exam Updated Vital Signs BP 128/71 (BP Location: Right Arm)   Pulse (!) 59   Temp 98.8 F (37.1 C) (Oral)   Resp 18   Ht 5\' 7"  (1.702 m)   Wt 103.8 kg   SpO2 97%   BMI 35.84 kg/m  Physical Exam Vitals and nursing note reviewed.  Constitutional:       General: She is not in acute distress.    Appearance: She is well-developed. She is not diaphoretic.  HENT:     Head: Normocephalic and atraumatic.     Right Ear: Tympanic membrane and external ear normal.     Left Ear: External ear normal. Decreased hearing noted. Tympanic membrane is not retracted.     Ears:     Weber exam findings: Does not lateralize.    Right Rinne: AC > BC.    Left Rinne: AC > BC.    Nose: Nose normal.     Mouth/Throat:     Mouth: Mucous membranes are moist.  Eyes:     General: No scleral icterus.    Conjunctiva/sclera: Conjunctivae normal.  Cardiovascular:     Rate and Rhythm: Normal rate and regular rhythm.     Heart sounds: Normal heart sounds. No murmur heard.    No friction rub. No gallop.  Pulmonary:     Effort: Pulmonary effort is normal. No respiratory distress.     Breath sounds: Normal breath sounds.  Abdominal:     General: Bowel sounds are normal. There is no distension.     Palpations: Abdomen is soft. There is no mass.     Tenderness: There is no abdominal tenderness. There is no guarding.  Musculoskeletal:     Cervical back: Normal range of motion.  Skin:    General: Skin is warm and dry.  Neurological:     Mental Status: She is alert and oriented to person, place, and time.  Psychiatric:        Behavior: Behavior normal.     ED Results / Procedures / Treatments   Labs (all labs ordered are listed, but only abnormal results are displayed) Labs Reviewed - No data to display  EKG None  Radiology No results found.  Procedures Procedures    Medications Ordered in ED Medications - No data to display  ED Course/ Medical Decision Making/ A&P                           Medical Decision Making Patient here with concern of decreased hearing and abnormal sounds in the left ear that sound like water dripping.  Differential diagnosis includes acute otitis media, ruptured TM, eustachian tube dysfunction, stroke, cerumen  faction. Patient's TMs are normal and equal bilaterally, no signs of otitis externa, cerumen impaction.  Decreased hearing on the left however hearing is maintained with tuning fork testing.  We will treat as eustachian tube dysfunction.  I have extremely low suspicion that she has a central cause.  We will have her follow-up with ENT.  Appears otherwise appropriate for discharge at this time           Final Clinical Impression(s) / ED Diagnoses Final diagnoses:  Eustachian tube dysfunction, left    Rx / DC Orders ED Discharge Orders          Ordered    methylPREDNISolone (MEDROL DOSEPAK) 4 MG TBPK tablet        07/30/22 1521              Margarita Mail, PA-C 07/30/22 1521    Fransico Meadow, MD 07/31/22 1728

## 2024-03-30 ENCOUNTER — Other Ambulatory Visit: Payer: Self-pay | Admitting: Internal Medicine

## 2024-03-30 DIAGNOSIS — E042 Nontoxic multinodular goiter: Secondary | ICD-10-CM
